# Patient Record
Sex: Female | Born: 1975 | Race: White | Hispanic: No | Marital: Married | Smoking: Never smoker
Health system: Southern US, Community
[De-identification: ages and names within clinical notes are randomized; demographics above are authoritative.]

---

## 2020-03-04 ENCOUNTER — Other Ambulatory Visit: Payer: Self-pay

## 2020-03-04 ENCOUNTER — Emergency Department: Payer: Federal, State, Local not specified - PPO

## 2020-03-04 ENCOUNTER — Emergency Department
Admission: EM | Admit: 2020-03-04 | Discharge: 2020-03-04 | Disposition: A | Discharge status: Home / Self Care | Payer: Federal, State, Local not specified - PPO | Attending: Student in an Organized Health Care Education/Training Program | Admitting: Student in an Organized Health Care Education/Training Program

## 2020-03-04 DIAGNOSIS — Y999 Unspecified external cause status: Secondary | ICD-10-CM | POA: Diagnosis not present

## 2020-03-04 DIAGNOSIS — S59911A Unspecified injury of right forearm, initial encounter: Secondary | ICD-10-CM | POA: Diagnosis present

## 2020-03-04 DIAGNOSIS — S52121A Displaced fracture of head of right radius, initial encounter for closed fracture: Secondary | ICD-10-CM | POA: Diagnosis not present

## 2020-03-04 DIAGNOSIS — Y9389 Activity, other specified: Secondary | ICD-10-CM | POA: Diagnosis not present

## 2020-03-04 DIAGNOSIS — W19XXXA Unspecified fall, initial encounter: Secondary | ICD-10-CM | POA: Diagnosis not present

## 2020-03-04 DIAGNOSIS — Y92524 Gas station as the place of occurrence of the external cause: Secondary | ICD-10-CM | POA: Diagnosis not present

## 2020-03-04 MED ORDER — OXYCODONE-ACETAMINOPHEN 10-325 MG PO TABS: 1.0000 | ORAL_TABLET | Freq: Three times a day (TID) | ORAL | 0 refills | Status: AC | PRN

## 2020-03-04 MED ORDER — OXYCODONE-ACETAMINOPHEN 5-325 MG PO TABS
1.0000 | ORAL_TABLET | Freq: Once | ORAL | Status: AC
  Administered 2020-03-04: 1 via ORAL
  Filled 2020-03-04: qty 1

## 2020-03-04 MED ORDER — IBUPROFEN 800 MG PO TABS: 800.0000 mg | ORAL_TABLET | Freq: Three times a day (TID) | ORAL | 0 refills | Status: AC | PRN

## 2020-03-04 MED ORDER — KETOROLAC TROMETHAMINE 30 MG/ML IJ SOLN
30.0000 mg | Freq: Once | INTRAMUSCULAR | Status: AC
  Administered 2020-03-04: 30 mg via INTRAVENOUS
  Filled 2020-03-04: qty 1

## 2020-03-04 MED ORDER — ONDANSETRON HCL 4 MG PO TABS: 4.0000 mg | ORAL_TABLET | Freq: Three times a day (TID) | ORAL | 0 refills | Status: AC | PRN

## 2020-03-04 MED ORDER — ONDANSETRON HCL 4 MG/2ML IJ SOLN
4.0000 mg | Freq: Once | INTRAMUSCULAR | Status: AC
  Administered 2020-03-04: 19:00:00 4 mg via INTRAVENOUS
  Filled 2020-03-04: qty 2

## 2020-03-04 MED ORDER — SODIUM CHLORIDE 0.9 % IV BOLUS
1000.0000 mL | Freq: Once | INTRAVENOUS | Status: AC
  Administered 2020-03-04: 19:00:00 1000 mL via INTRAVENOUS

## 2020-03-04 NOTE — ED Notes (Signed)
X-ray at bedside

## 2020-03-04 NOTE — Discharge Instructions (Signed)
You have been diagnosed with an impacted, mildly angulated radial head fracture Oxycodone can be taken every 8 hours.  Please also alternate with ibuprofen. Increase hydration at home and MiraLAX can be taken for constipation.  Please follow-up with orthopedics as soon as possible.

## 2020-03-04 NOTE — ED Triage Notes (Signed)
Pt here via [facility] d/t mechanical fall. Pt complaining of right elbow/ arm pain.   Pt denies hitting head or LOC.   Pt received of fentanyl and 4mg  zofran with EMS.

## 2020-03-04 NOTE — ED Provider Notes (Signed)
Emergency Department Provider Note  ____________________________________________  Time seen: Approximately 7:05 PM  I have reviewed the triage vital signs and the nursing notes.   HISTORY  Chief Complaint Fall and Arm Pain   Historian Patient     HPI Dana Pineda is a 44 y.o. female presents to the emergency department with right elbow pain after patient had a mechanical fall at a local [place]. Patient did not hit her head or neck.  She has been able to move her fingers since injury occurred and denies numbness and tingling.  No abrasions or lacerations.  No other alleviating measures have been attempted.   History reviewed. No pertinent past medical history.   Immunizations up to date:  Yes.     History reviewed. No pertinent past medical history.  There are no problems to display for this patient.   History reviewed. No pertinent surgical history.  Prior to Admission medications   Medication Sig Start Date End Date Taking? Authorizing Provider  ibuprofen (ADVIL) 800 MG tablet Take 1 tablet (800 mg total) by mouth every 8 (eight) hours as needed. 03/04/20   Orvil Feil, PA-C  ondansetron (ZOFRAN) 4 MG tablet Take 1 tablet (4 mg total) by mouth every 8 (eight) hours as needed for nausea or vomiting. 03/04/20   Orvil Feil, PA-C  oxyCODONE-acetaminophen (PERCOCET) 10-325 MG tablet Take 1 tablet by mouth every 8 (eight) hours as needed for up to 5 days for pain. 03/04/20 03/09/20  Orvil Feil, PA-C    Allergies Codeine  History reviewed. No pertinent family history.  Social History Social History   Tobacco Use  . Smoking status: Never Smoker  . Smokeless tobacco: Never Used  Substance Use Topics  . Alcohol use: Never  . Drug use: Never     Review of Systems  Constitutional: No fever/chills Eyes:  No discharge ENT: No upper respiratory complaints. Respiratory: no cough. No SOB/ use of accessory muscles to breath Gastrointestinal:   No  nausea, no vomiting.  No diarrhea.  No constipation. Musculoskeletal: Patient has right elbow pain.  Skin: Negative for rash, abrasions, lacerations, ecchymosis.    ____________________________________________   PHYSICAL EXAM:  VITAL SIGNS: ED Triage Vitals  Enc Vitals Group     BP 03/04/20 1827 101/69     Pulse Rate 03/04/20 1827 79     Resp 03/04/20 1827 18     Temp 03/04/20 1827 98.2 F (36.8 C)     Temp Source 03/04/20 1827 Oral     SpO2 03/04/20 1827 97 %     Weight 03/04/20 1821 170 lb (77.1 kg)     Height 03/04/20 1821 5\' 9"  (1.753 m)     Head Circumference --      Peak Flow --      Pain Score 03/04/20 1821 5     Pain Loc --      Pain Edu? --      Excl. in GC? --      Constitutional: Alert and oriented. Well appearing and in no acute distress. Eyes: Conjunctivae are normal. PERRL. EOMI. Head: Atraumatic. Cardiovascular: Normal rate, regular rhythm. Normal S1 and S2.  Good peripheral circulation. Respiratory: Normal respiratory effort without tachypnea or retractions. Lungs CTAB. Good air entry to the bases with no decreased or absent breath sounds Gastrointestinal: Bowel sounds x 4 quadrants. Soft and nontender to palpation. No guarding or rigidity. No distention. Musculoskeletal: Patient is unable to perform full range of motion at the right elbow, likely  secondary to pain.  She is able to move all 5 right fingers.  She can spread her right fingers and can perform flexion at the IP joint of the right thumb.  Palpable radial pulse, right. Neurologic:  Normal for age. No gross focal neurologic deficits are appreciated.  Skin:  Skin is warm, dry and intact. No rash noted. Psychiatric: Mood and affect are normal for age. Speech and behavior are normal.   ____________________________________________   LABS (all labs ordered are listed, but only abnormal results are displayed)  Labs Reviewed - No data to  display ____________________________________________  EKG   ____________________________________________  RADIOLOGY Unk Pinto, personally viewed and evaluated these images (plain radiographs) as part of my medical decision making, as well as reviewing the written report by the radiologist.  DG Elbow Complete Right  Result Date: 03/04/2020 CLINICAL DATA:  C/o pain after tripping over bar; pt unable to bend elbow EXAM: LEFT FOREARM - 2 VIEW; RIGHT ELBOW - COMPLETE 3+ VIEW COMPARISON:  None. FINDINGS: There is a mildly displaced impacted fracture of the radial head. No evidence of dislocation. No additional acute finding in the elbow or ulna. There is a small joint effusion. IMPRESSION: Mildly displaced impacted fracture of the r right radial head. Electronically Signed   By: Audie Pinto M.D.   On: 03/04/2020 18:59   DG Forearm Left  Result Date: 03/04/2020 CLINICAL DATA:  C/o pain after tripping over bar; pt unable to bend elbow EXAM: LEFT FOREARM - 2 VIEW; RIGHT ELBOW - COMPLETE 3+ VIEW COMPARISON:  None. FINDINGS: There is a mildly displaced impacted fracture of the radial head. No evidence of dislocation. No additional acute finding in the elbow or ulna. There is a small joint effusion. IMPRESSION: Mildly displaced impacted fracture of the r right radial head. Electronically Signed   By: Audie Pinto M.D.   On: 03/04/2020 18:59    ____________________________________________    PROCEDURES  Procedure(s) performed:     Procedures     Medications  sodium chloride 0.9 % bolus 1,000 mL (1,000 mLs Intravenous New Bag/Given 03/04/20 1832)  ondansetron (ZOFRAN) injection 4 mg (4 mg Intravenous Given 03/04/20 1833)  ketorolac (TORADOL) 30 MG/ML injection 30 mg (30 mg Intravenous Given 03/04/20 1949)  oxyCODONE-acetaminophen (PERCOCET/ROXICET) 5-325 MG per tablet 1 tablet (1 tablet Oral Given 03/04/20 1949)     ____________________________________________   INITIAL  IMPRESSION / ASSESSMENT AND PLAN / ED COURSE  Pertinent labs & imaging results that were available during my care of the patient were reviewed by me and considered in my medical decision making (see chart for details).      Assessment and Plan:  Right elbow pain:  44 year old female presents to the emergency department with right elbow pain after a mechanical, nonsyncopal fall.  Patient has been unable to perform full range of motion at right elbow since injury occurred.  X-ray examination reveals a mildly displaced, impacted right radial head fracture.  Patient was placed in a posterior long-arm splint at 90 degrees and advised to follow-up with orthopedics.  Patient was discharged with oxycodone, Zofran and ibuprofen 800.  Patient was advised to follow-up with orthopedics as soon as possible.  Return precautions were given to return with new or worsening symptoms.  All patient questions were answered.  ____________________________________________  FINAL CLINICAL IMPRESSION(S) / ED DIAGNOSES  Final diagnoses:  Closed displaced fracture of head of right radius, initial encounter      NEW MEDICATIONS STARTED DURING THIS VISIT:  ED Discharge Orders         Ordered    oxyCODONE-acetaminophen (PERCOCET) 10-325 MG tablet  Every 8 hours PRN     03/04/20 2040    ondansetron (ZOFRAN) 4 MG tablet  Every 8 hours PRN     03/04/20 2040    ibuprofen (ADVIL) 800 MG tablet  Every 8 hours PRN     03/04/20 2040              This chart was dictated using voice recognition software/Dragon. Despite best efforts to proofread, errors can occur which can change the meaning. Any change was purely unintentional.     Gasper Lloyd 03/04/20 2120    Willy Eddy, MD 03/04/20 2256

## 2021-10-12 IMAGING — DX DG ELBOW COMPLETE 3+V*R*
4 series · 5 of 5 positions shown · non-contrast
Comparison: None.

CLINICAL DATA: C/o pain after tripping over bar; pt unable to bend
elbow

EXAM:
LEFT FOREARM - 2 VIEW; RIGHT ELBOW - COMPLETE 3+ VIEW

[elbow obl (1 of 2)]
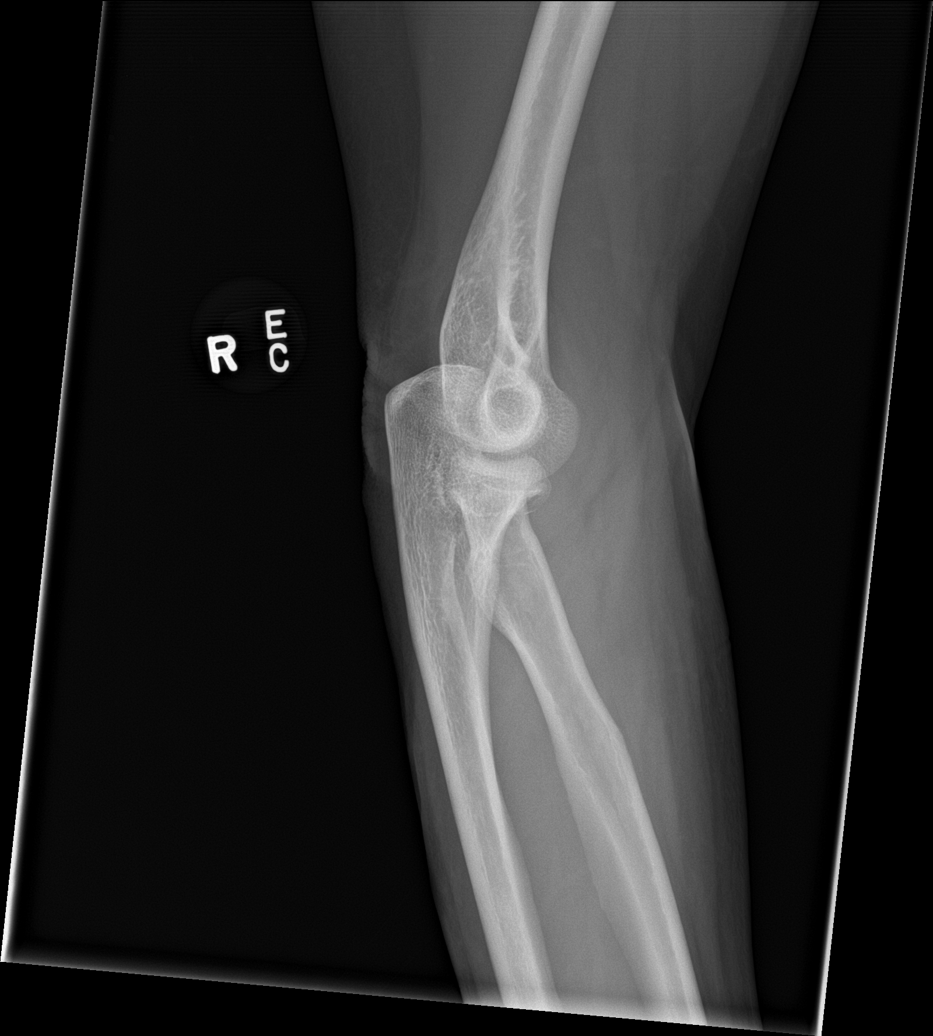

[elbow ap]
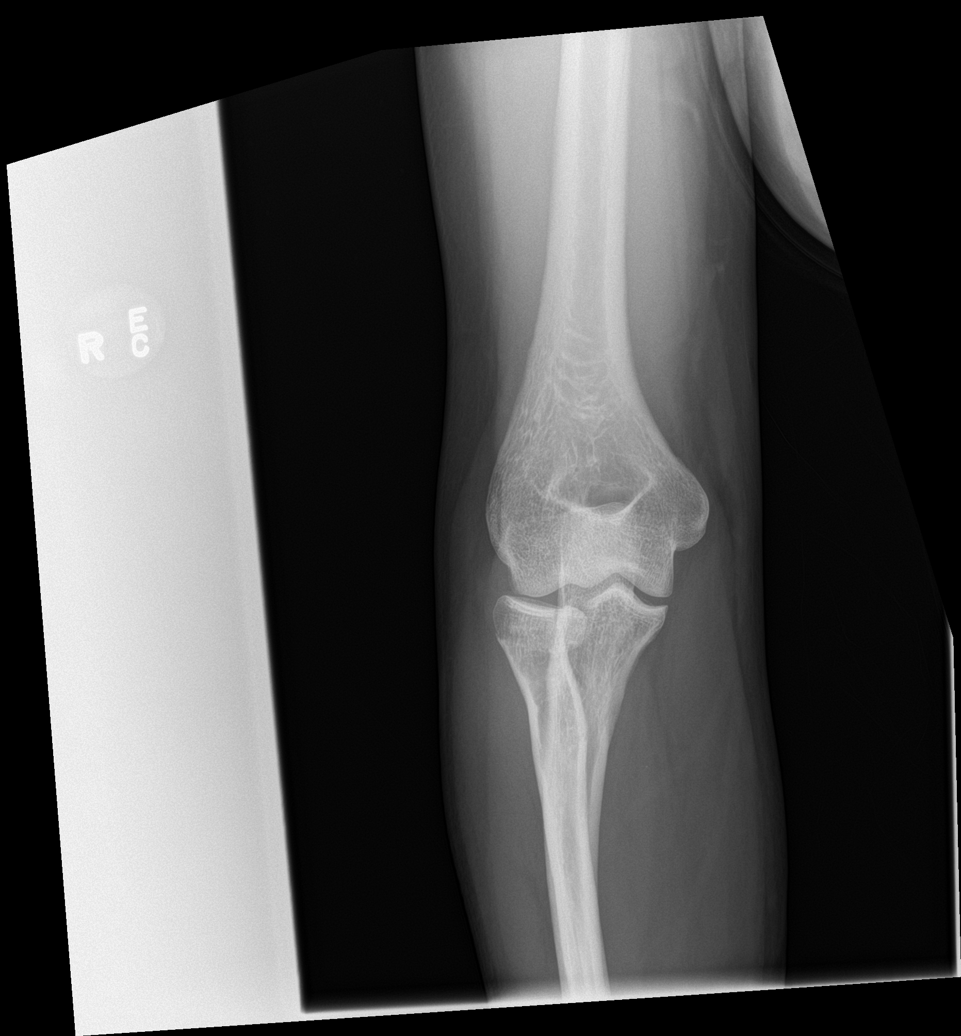

[elbow obl (2 of 2)]
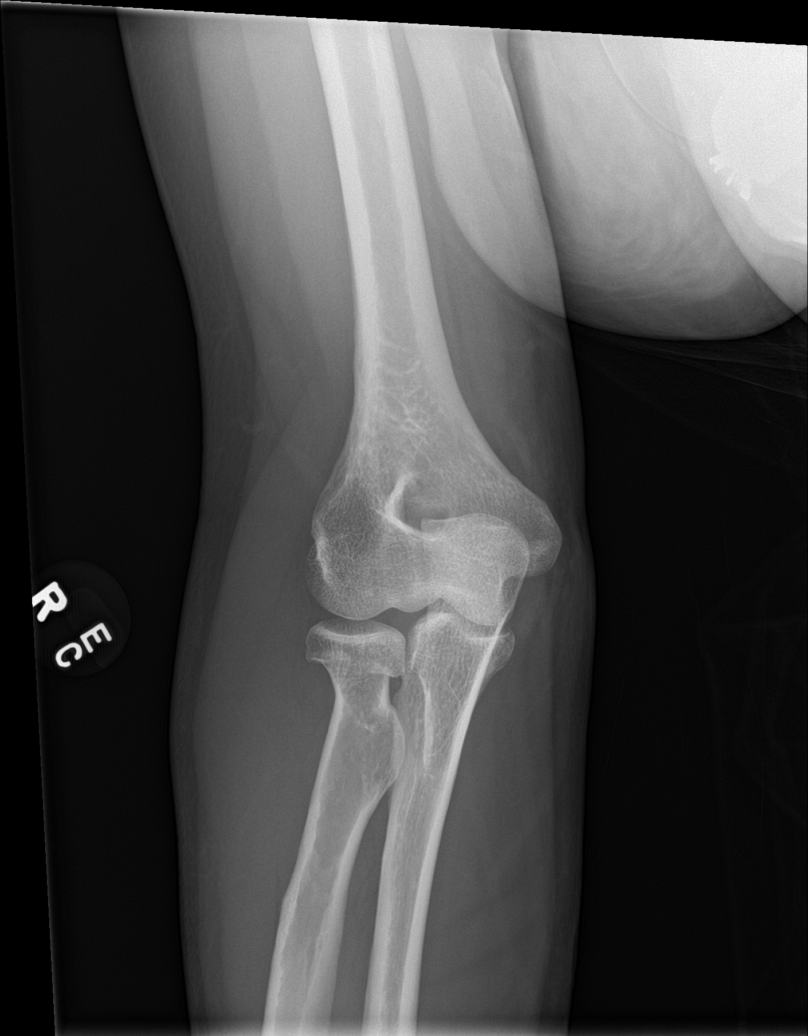

[Series 4: elbow lat · 0.14mm/px · 2 of 2 slices shown]
[im 1/2]
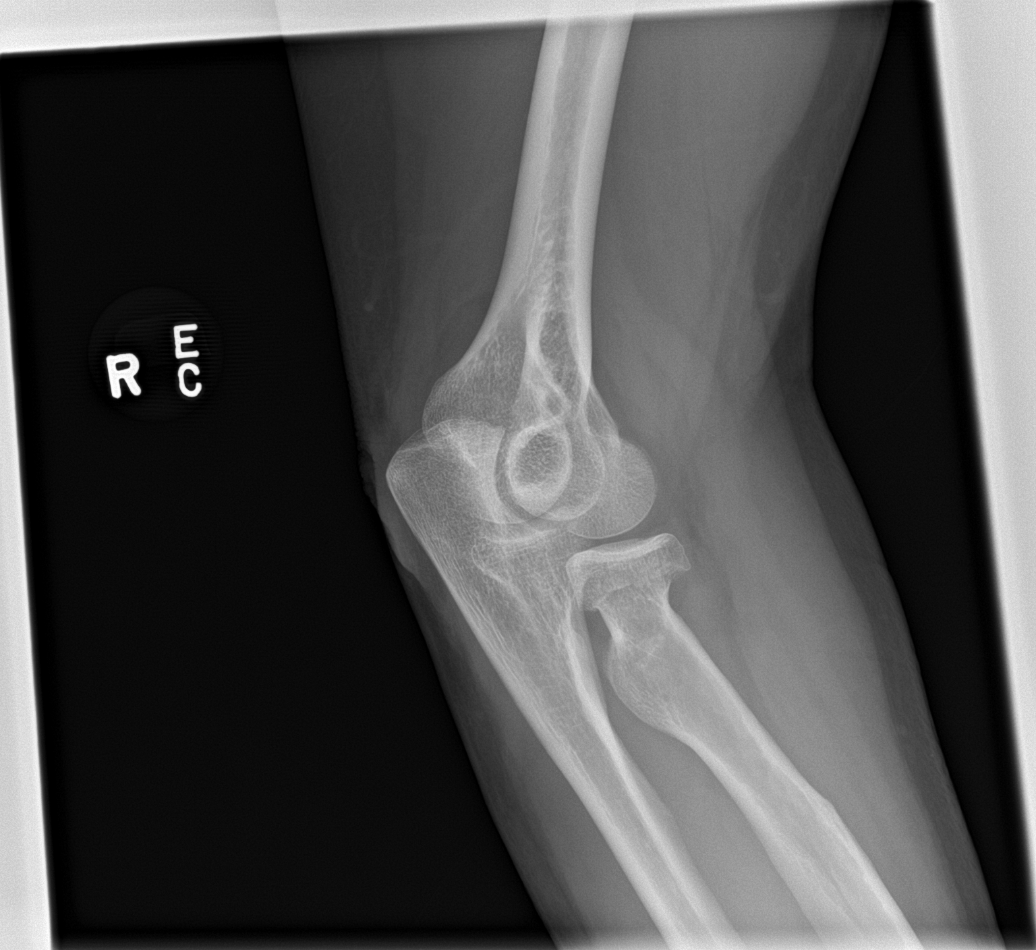
[im 2/2]
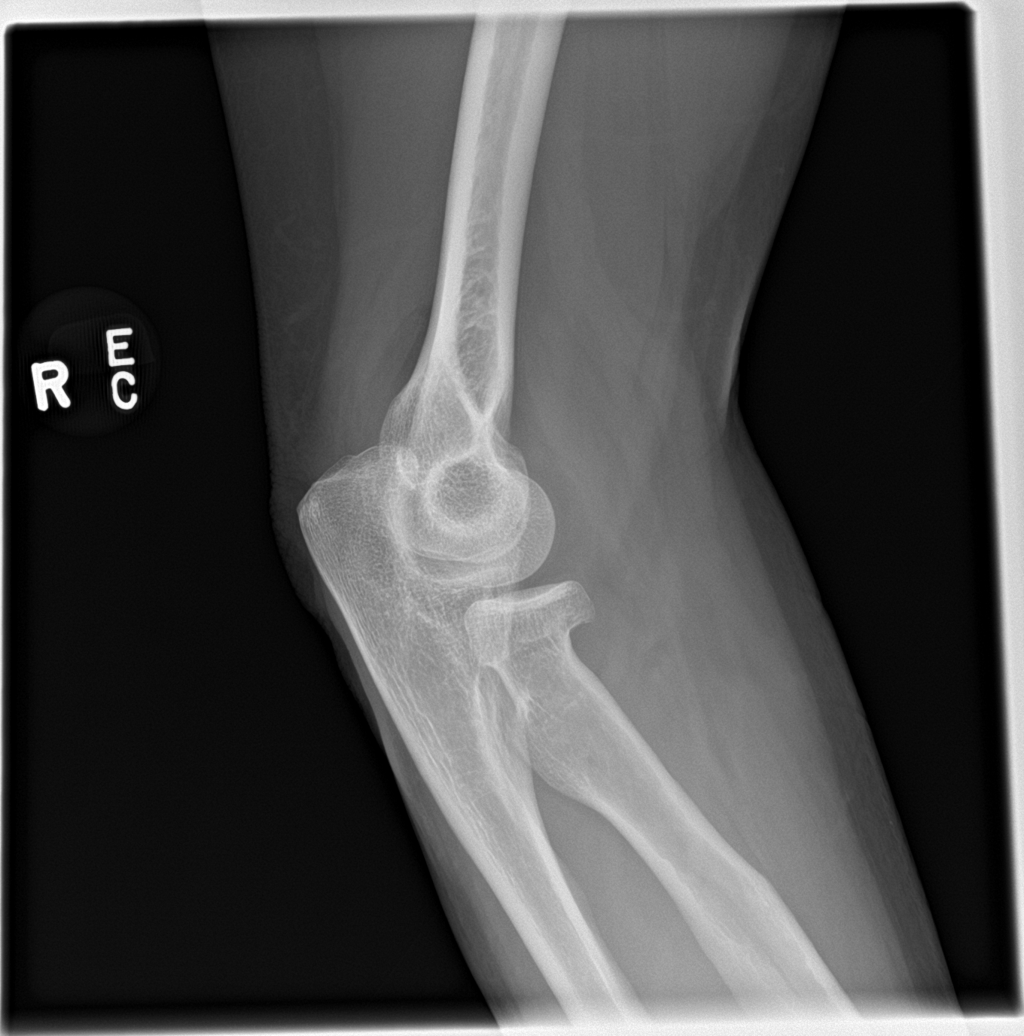

[5 of 5 positions shown; findings below may reference images not displayed]

FINDINGS: There is a mildly displaced impacted fracture of the radial head. No
evidence of dislocation. No additional acute finding in the elbow or
ulna. There is a small joint effusion.
IMPRESSION: Mildly displaced impacted fracture of the r right radial head.
